# Patient Record
Sex: Female | Born: 1990 | Race: Black or African American | Hispanic: No | Marital: Single | Smoking: Former smoker
Health system: Southern US, Community
[De-identification: ages and names within clinical notes are randomized; demographics above are authoritative.]

## PROBLEM LIST (undated history)

## (undated) DIAGNOSIS — K219 Gastro-esophageal reflux disease without esophagitis: Secondary | ICD-10-CM

## (undated) DIAGNOSIS — IMO0001 Reserved for inherently not codable concepts without codable children: Secondary | ICD-10-CM

---

## 2011-04-29 ENCOUNTER — Emergency Department (HOSPITAL_BASED_OUTPATIENT_CLINIC_OR_DEPARTMENT_OTHER)
Admission: EM | Admit: 2011-04-29 | Discharge: 2011-04-29 | Disposition: A | Discharge status: Home / Self Care | Payer: Self-pay | Attending: Emergency Medicine | Admitting: Emergency Medicine

## 2011-04-29 ENCOUNTER — Encounter: Payer: Self-pay | Admitting: *Deleted

## 2011-04-29 DIAGNOSIS — J45909 Unspecified asthma, uncomplicated: Secondary | ICD-10-CM | POA: Insufficient documentation

## 2011-04-29 DIAGNOSIS — J069 Acute upper respiratory infection, unspecified: Secondary | ICD-10-CM | POA: Insufficient documentation

## 2011-04-29 DIAGNOSIS — R51 Headache: Secondary | ICD-10-CM | POA: Insufficient documentation

## 2011-04-29 MED ORDER — FLUTICASONE PROPIONATE 50 MCG/ACT NA SUSP: 2.0000 | Freq: Every day | NASAL | Status: DC

## 2011-04-29 NOTE — ED Notes (Signed)
Pt states she has had congestion, H/A, sinus pressure and body aches for 2 days. Mother had flu.

## 2011-04-29 NOTE — Discharge Instructions (Signed)
Antibiotic Nonuse  Your caregiver felt that the infection or problem was not one that would be helped with an antibiotic. Infections may be caused by viruses or bacteria. Only a caregiver can tell which one of these is the likely cause of an illness. A cold is the most common cause of infection in both adults and children. A cold is a virus. Antibiotic treatment will have no effect on a viral infection. Viruses can lead to many lost days of work caring for sick children and many missed days of school. Children may catch as many as 10 "colds" or "flus" per year during which they can be tearful, cranky, and uncomfortable. The goal of treating a virus is aimed at keeping the ill person comfortable. Antibiotics are medications used to help the body fight bacterial infections. There are relatively few types of bacteria that cause infections but there are hundreds of viruses. While both viruses and bacteria cause infection they are very different types of germs. A viral infection will typically go away by itself within 7 to 10 days. Bacterial infections may spread or get worse without antibiotic treatment. Examples of bacterial infections are:  Sore throats (like strep throat or tonsillitis).     Infection in the lung (pneumonia).     Ear and skin infections.  Examples of viral infections are:  Colds or flus.     Most coughs and bronchitis.     Sore throats not caused by Strep.     Runny noses.  It is often best not to take an antibiotic when a viral infection is the cause of the problem. Antibiotics can kill off the helpful bacteria that we have inside our body and allow harmful bacteria to start growing. Antibiotics can cause side effects such as allergies, nausea, and diarrhea without helping to improve the symptoms of the viral infection. Additionally, repeated uses of antibiotics can cause bacteria inside of our body to become resistant. That resistance can be passed onto harmful bacterial. The  next time you have an infection it may be harder to treat if antibiotics are used when they are not needed. Not treating with antibiotics allows our own immune system to develop and take care of infections more efficiently. Also, antibiotics will work better for us when they are prescribed for bacterial infections. Treatments for a child that is ill may include:  Give extra fluids throughout the day to stay hydrated.     Get plenty of rest.     Only give your child over-the-counter or prescription medicines for pain, discomfort, or fever as directed by your caregiver.     The use of a cool mist humidifier may help stuffy noses.     Cold medications if suggested by your caregiver.  Your caregiver may decide to start you on an antibiotic if:  The problem you were seen for today continues for a longer length of time than expected.     You develop a secondary bacterial infection.  SEEK MEDICAL CARE IF:  Fever lasts longer than 5 days.     Symptoms continue to get worse after 5 to 7 days or become severe.     Difficulty in breathing develops.     Signs of dehydration develop (poor drinking, rare urinating, dark colored urine).     Changes in behavior or worsening tiredness (listlessness or lethargy).  Document Released: 06/18/2001 Document Revised: 12/20/2010 Document Reviewed: 12/15/2008 [employer] Patient Information 2012 [employer]. 

## 2011-04-29 NOTE — ED Provider Notes (Signed)
History     CSN: [id number]  Arrival date & time 04/29/11  1646   First MD Initiated Contact with Patient 04/29/11 1759      Chief Complaint  Patient presents with  . Influenza    (Consider location/radiation/quality/duration/timing/severity/associated sxs/prior treatment) Patient is a 21 y.o. female presenting with flu symptoms. The history is provided by the patient. No language interpreter was used.  Influenza This is a new problem. The current episode started in the past 7 days. The problem occurs constantly. The problem has been unchanged. Associated symptoms include headaches and a sore throat. Pertinent negatives include no fever or vomiting. The symptoms are aggravated by nothing. She has tried NSAIDs and acetaminophen for the symptoms.    Past Medical History  Diagnosis Date  . Asthma     History reviewed. No pertinent past surgical history.  History reviewed. No pertinent family history.  History  Substance Use Topics  . Smoking status: Current Some Day Smoker  . Smokeless tobacco: Not on file  . Alcohol Use: No    OB History    Grav Para Term Preterm Abortions TAB SAB Ect Mult Living                  Review of Systems  Constitutional: Negative for fever.  HENT: Positive for sore throat.   Gastrointestinal: Negative for vomiting.  Neurological: Positive for headaches.  All other systems reviewed and are negative.    Allergies  Review of patient's allergies indicates no known allergies.  Home Medications   Current Outpatient Rx  Name Route Sig Dispense Refill  . GUAIFENESIN 100 MG/5ML PO SYRP Oral Take 200 mg by mouth every 4 (four) hours as needed. For cough     . OMEPRAZOLE 20 MG PO CPDR Oral Take 20 mg by mouth daily.      Marland Kitchen PHENYLEPH-CPM-DM-ASPIRIN 7.11-22-08-325 MG PO TBEF Oral Take 2 tablets by mouth every 4 (four) hours as needed. For congestion     . FLUTICASONE PROPIONATE 50 MCG/ACT NA SUSP Nasal Place 2 sprays into the nose daily. 16 g 2     BP 132/80  Pulse 104  Temp(Src) 99.4 F (37.4 C) (Oral)  Resp 18  Ht 5\' 4"  (1.626 m)  Wt 150 lb (68.04 kg)  BMI 25.75 kg/m2  SpO2 100%  Physical Exam  Nursing note and vitals reviewed. Constitutional: She is oriented to person, place, and time. She appears well-developed and well-nourished.  HENT:  Head: Normocephalic and atraumatic.  Right Ear: External ear normal.  Left Ear: External ear normal.  Nose: Rhinorrhea present.  Mouth/Throat: Posterior oropharyngeal erythema present.  Neck: Neck supple.  Cardiovascular: Normal rate and regular rhythm.   Pulmonary/Chest: Effort normal and breath sounds normal.  Abdominal: Soft. Bowel sounds are normal.  Musculoskeletal: Normal range of motion.  Neurological: She is alert and oriented to person, place, and time.    ED Course  Procedures (including critical care time)  Labs Reviewed - No data to display No results found.   1. URI (upper respiratory infection)       MDM  Consistent with WGN:FAOZ treat symptomatically        Teressa Lower, NP 04/29/11 1820

## 2011-05-04 NOTE — ED Provider Notes (Signed)
Evaluation and management procedures were performed by the PA/NP under my supervision/collaboration.    Eastin Swing D Zayveon Raschke, MD 05/04/11 1718 

## 2011-07-26 ENCOUNTER — Encounter (HOSPITAL_BASED_OUTPATIENT_CLINIC_OR_DEPARTMENT_OTHER): Payer: Self-pay

## 2011-07-26 ENCOUNTER — Emergency Department (HOSPITAL_BASED_OUTPATIENT_CLINIC_OR_DEPARTMENT_OTHER)
Admission: EM | Admit: 2011-07-26 | Discharge: 2011-07-26 | Disposition: A | Discharge status: Home / Self Care | Payer: Self-pay | Attending: Emergency Medicine | Admitting: Emergency Medicine

## 2011-07-26 DIAGNOSIS — F17200 Nicotine dependence, unspecified, uncomplicated: Secondary | ICD-10-CM | POA: Insufficient documentation

## 2011-07-26 DIAGNOSIS — K219 Gastro-esophageal reflux disease without esophagitis: Secondary | ICD-10-CM | POA: Insufficient documentation

## 2011-07-26 DIAGNOSIS — R109 Unspecified abdominal pain: Secondary | ICD-10-CM | POA: Insufficient documentation

## 2011-07-26 HISTORY — DX: Reserved for inherently not codable concepts without codable children: IMO0001

## 2011-07-26 HISTORY — DX: Gastro-esophageal reflux disease without esophagitis: K21.9

## 2011-07-26 LAB — URINALYSIS, ROUTINE W REFLEX MICROSCOPIC
Bilirubin Urine: NEGATIVE
Glucose, UA: NEGATIVE mg/dL
Hgb urine dipstick: NEGATIVE
Ketones, ur: NEGATIVE mg/dL
Protein, ur: 30 mg/dL — AB

## 2011-07-26 LAB — URINE MICROSCOPIC-ADD ON

## 2011-07-26 NOTE — ED Provider Notes (Signed)
Medical screening examination/treatment/procedure(s) were performed by non-physician practitioner and as supervising physician I was immediately available for consultation/collaboration.   Rosielee Corporan, MD 07/26/11 1625 

## 2011-07-26 NOTE — ED Notes (Signed)
Pt reports onset of nausea and abdominal pain that started yesterday.

## 2011-07-26 NOTE — ED Provider Notes (Signed)
History     CSN: [id number]  Arrival date & time 07/26/11  1347   First MD Initiated Contact with Patient 07/26/11 1437      Chief Complaint  Patient presents with  . Abdominal Pain  . Nausea    (Consider location/radiation/quality/duration/timing/severity/associated sxs/prior treatment) HPI Comments: Pt state that she had an onset of upper abdominal pain yesterday which has resolved today:pt states that she came in today because she was seen at [facility] yesterday and they called her and told her she had a sign of infection in her blood work:pt state that she was recently diagnosed with gerd and she has had these symptoms previously:pt denies fever or previous surgeries  Patient is a 21 y.o. female presenting with abdominal pain. The history is provided by the patient. No language interpreter was used.  Abdominal Pain The primary symptoms of the illness include abdominal pain and nausea. The primary symptoms of the illness do not include fever, vomiting, diarrhea, vaginal discharge or vaginal bleeding. The current episode started yesterday. The onset of the illness was gradual. The problem has been resolved.  The patient states that she believes she is currently not pregnant. The patient has not had a change in bowel habit. Symptoms associated with the illness do not include urgency, hematuria, frequency or back pain.    Past Medical History  Diagnosis Date  . Asthma   . Reflux     History reviewed. No pertinent past surgical history.  No family history on file.  History  Substance Use Topics  . Smoking status: Current Some Day Smoker  . Smokeless tobacco: Not on file  . Alcohol Use: No    OB History    Grav Para Term Preterm Abortions TAB SAB Ect Mult Living                  Review of Systems  Constitutional: Negative for fever.  Gastrointestinal: Positive for nausea and abdominal pain. Negative for vomiting and diarrhea.  Genitourinary: Negative for urgency,  frequency, hematuria, vaginal bleeding and vaginal discharge.  Musculoskeletal: Negative for back pain.  All other systems reviewed and are negative.    Allergies  Review of patient's allergies indicates no known allergies.  Home Medications   Current Outpatient Rx  Name Route Sig Dispense Refill  . OMEPRAZOLE PO Oral Take 1 capsule by mouth daily as needed.    Marland Kitchen ZEGERID PO Oral Take 1 tablet by mouth daily as needed. Patient used this medication for acid reflux.    . OMEPRAZOLE 20 MG PO CPDR Oral Take 20 mg by mouth daily.        BP 121/84  Pulse 106  Temp(Src) 98.1 F (36.7 C) (Oral)  Resp 16  Ht 5\' 4"  (1.626 m)  Wt 160 lb (72.576 kg)  BMI 27.46 kg/m2  SpO2 99%  Physical Exam  Nursing note and vitals reviewed. Constitutional: She is oriented to person, place, and time. She appears well-developed and well-nourished.  HENT:  Head: Normocephalic and atraumatic.  Eyes: Conjunctivae and EOM are normal.  Neck: Neck supple.  Cardiovascular: Normal rate and regular rhythm.   Pulmonary/Chest: Effort normal and breath sounds normal.  Abdominal: Soft. Bowel sounds are normal. There is no tenderness.  Musculoskeletal: Normal range of motion.  Neurological: She is alert and oriented to person, place, and time.  Skin: Skin is warm and dry.  Psychiatric: She has a normal mood and affect.    ED Course  Procedures (including critical care time)  Labs Reviewed  URINALYSIS, ROUTINE W REFLEX MICROSCOPIC - Abnormal; Notable for the following:    Protein, ur 30 (*)    All other components within normal limits  URINE MICROSCOPIC-ADD ON - Abnormal; Notable for the following:    Squamous Epithelial / LPF FEW (*)    Bacteria, UA FEW (*)    Casts HYALINE CASTS (*)    All other components within normal limits  PREGNANCY, URINE   No results found.   1. Abdominal pain       MDM  [facility] and [facility];pt had and elevated wbc:don't think pt needs any  imaging:pt is not symptomatic today and abdominal exam is benign        Teressa Lower, NP 07/26/11 1601

## 2011-07-26 NOTE — Discharge Instructions (Signed)

## 2012-06-21 ENCOUNTER — Encounter (HOSPITAL_BASED_OUTPATIENT_CLINIC_OR_DEPARTMENT_OTHER): Payer: Self-pay | Admitting: *Deleted

## 2012-06-21 ENCOUNTER — Emergency Department (HOSPITAL_BASED_OUTPATIENT_CLINIC_OR_DEPARTMENT_OTHER)
Admission: EM | Admit: 2012-06-21 | Discharge: 2012-06-21 | Disposition: A | Discharge status: Home / Self Care | Payer: Self-pay | Attending: Emergency Medicine | Admitting: Emergency Medicine

## 2012-06-21 DIAGNOSIS — N760 Acute vaginitis: Secondary | ICD-10-CM | POA: Insufficient documentation

## 2012-06-21 DIAGNOSIS — Z79899 Other long term (current) drug therapy: Secondary | ICD-10-CM | POA: Insufficient documentation

## 2012-06-21 DIAGNOSIS — R51 Headache: Secondary | ICD-10-CM | POA: Insufficient documentation

## 2012-06-21 DIAGNOSIS — K219 Gastro-esophageal reflux disease without esophagitis: Secondary | ICD-10-CM | POA: Insufficient documentation

## 2012-06-21 DIAGNOSIS — R110 Nausea: Secondary | ICD-10-CM | POA: Insufficient documentation

## 2012-06-21 DIAGNOSIS — B9689 Other specified bacterial agents as the cause of diseases classified elsewhere: Secondary | ICD-10-CM

## 2012-06-21 DIAGNOSIS — N898 Other specified noninflammatory disorders of vagina: Secondary | ICD-10-CM | POA: Insufficient documentation

## 2012-06-21 DIAGNOSIS — Z3202 Encounter for pregnancy test, result negative: Secondary | ICD-10-CM | POA: Insufficient documentation

## 2012-06-21 DIAGNOSIS — N949 Unspecified condition associated with female genital organs and menstrual cycle: Secondary | ICD-10-CM | POA: Insufficient documentation

## 2012-06-21 DIAGNOSIS — J45909 Unspecified asthma, uncomplicated: Secondary | ICD-10-CM | POA: Insufficient documentation

## 2012-06-21 DIAGNOSIS — J3489 Other specified disorders of nose and nasal sinuses: Secondary | ICD-10-CM | POA: Insufficient documentation

## 2012-06-21 DIAGNOSIS — F17200 Nicotine dependence, unspecified, uncomplicated: Secondary | ICD-10-CM | POA: Insufficient documentation

## 2012-06-21 LAB — CBC WITH DIFFERENTIAL/PLATELET
Hemoglobin: 12.4 g/dL (ref 12.0–15.0)
Lymphs Abs: 1.7 10*3/uL (ref 0.7–4.0)
MCH: 27.2 pg (ref 26.0–34.0)
Monocytes Relative: 6 % (ref 3–12)
Neutro Abs: 8.9 10*3/uL — ABNORMAL HIGH (ref 1.7–7.7)
Neutrophils Relative %: 76 % (ref 43–77)
RBC: 4.56 MIL/uL (ref 3.87–5.11)

## 2012-06-21 LAB — URINALYSIS, ROUTINE W REFLEX MICROSCOPIC
Hgb urine dipstick: NEGATIVE
Protein, ur: NEGATIVE mg/dL
Urobilinogen, UA: 0.2 mg/dL (ref 0.0–1.0)

## 2012-06-21 LAB — WET PREP, GENITAL

## 2012-06-21 LAB — PREGNANCY, URINE: Preg Test, Ur: NEGATIVE

## 2012-06-21 MED ORDER — FLUCONAZOLE 150 MG PO TABS: 150.0000 mg | ORAL_TABLET | Freq: Once | ORAL | Status: DC

## 2012-06-21 MED ORDER — RANITIDINE HCL 150 MG PO TABS: 150.0000 mg | ORAL_TABLET | Freq: Two times a day (BID) | ORAL | Status: DC

## 2012-06-21 MED ORDER — METRONIDAZOLE 500 MG PO TABS: 500.0000 mg | ORAL_TABLET | Freq: Two times a day (BID) | ORAL | Status: DC

## 2012-06-21 NOTE — ED Provider Notes (Signed)
Medical screening examination/treatment/procedure(s) were performed by non-physician practitioner and as supervising physician I was immediately available for consultation/collaboration.   Surena Welge B. Rahmel Nedved, MD 06/21/12 1915 

## 2012-06-21 NOTE — ED Notes (Signed)
Pt states she has a hx of reflux and has been having abd pain and bloating x 1 month. No relief with meds. Trouble with BM's last one today. No urinary s/s. Some clr vag d/c.

## 2012-06-21 NOTE — ED Provider Notes (Signed)
History     CSN: [id number]  Arrival date & time 06/21/12  1659   First MD Initiated Contact with Patient 06/21/12 1705      Chief Complaint  Patient presents with  . Abdominal Pain    (Consider location/radiation/quality/duration/timing/severity/associated sxs/prior Treatment)  HPI Jasmin Williams is a 22 y.o. female who presents to the ED with abdominal pain. The pain started one month ago. The pain is located in the lower abdomen. She rates the pain as 7/10. She describes the pain as feeling bloated and cramping. LMP 06/07/12. No birth control. Vaginal discharge. Last pap smear was last year and was normal. Current sex partner x 7 years. No history of STI's. She reports having more BM's than usual and softer but not diarrhea.  She has a history of reflux. Has been on Prilosec in the past not not currently. Has taken some over the counter medication without relief. The history was provided by the patient.   Past Medical History  Diagnosis Date  . Asthma   . Reflux     History reviewed. No pertinent past surgical history.  History reviewed. No pertinent family history.  History  Substance Use Topics  . Smoking status: Current Some Day Smoker  . Smokeless tobacco: Not on file  . Alcohol Use: No    OB History   Grav Para Term Preterm Abortions TAB SAB Ect Mult Living                  Review of Systems  Constitutional: Negative for fever, chills, diaphoresis and fatigue.  HENT: Positive for sinus pressure. Negative for ear pain, congestion, sore throat, facial swelling, neck pain, neck stiffness and dental problem.   Eyes: Negative for photophobia, pain and discharge.  Respiratory: Negative for cough, chest tightness and wheezing.   Gastrointestinal: Positive for nausea and abdominal pain. Negative for vomiting, diarrhea, constipation and abdominal distention.  Genitourinary: Positive for vaginal discharge and pelvic pain. Negative for dysuria, frequency, flank pain,  vaginal bleeding and difficulty urinating.  Musculoskeletal: Negative for myalgias, back pain and gait problem.  Skin: Negative for color change and rash.  Allergic/Immunologic: Negative for immunocompromised state.  Neurological: Positive for headaches. Negative for dizziness, speech difficulty, weakness, light-headedness and numbness.  Psychiatric/Behavioral: Negative for confusion and agitation. The patient is not nervous/anxious.     Allergies  Review of patient's allergies indicates no known allergies.  Home Medications   Current Outpatient Rx  Name  Route  Sig  Dispense  Refill  . omeprazole (PRILOSEC) 20 MG capsule   Oral   Take 20 mg by mouth daily.           Marland Kitchen OMEPRAZOLE PO   Oral   Take 1 capsule by mouth daily as needed.         Maxwell Caul Bicarbonate (ZEGERID PO)   Oral   Take 1 tablet by mouth daily as needed. Patient used this medication for acid reflux.           BP 122/76  Pulse 98  Temp(Src) 98.2 F (36.8 C) (Oral)  Resp 20  Ht 5\' 3"  (1.6 m)  Wt 180 lb (81.647 kg)  BMI 31.89 kg/m2  SpO2 100%  LMP 06/06/2012  Physical Exam  Nursing note and vitals reviewed. Constitutional: She is oriented to person, place, and time. She appears well-developed and well-nourished.  HENT:  Head: Normocephalic and atraumatic.  Eyes: EOM are normal. Pupils are equal, round, and reactive to light.  Neck: Neck supple.  Cardiovascular: Normal rate.   Pulmonary/Chest: Effort normal. No respiratory distress.  Abdominal: Soft.  Mildly tender lower abdomen. No guarding or rebound.  Genitourinary:  External genitalia without lesions. White discharge vaginal vault. Mild CMT, left adnexal tenderness that is mild. Uterus without palpable enlargement.  Musculoskeletal: Normal range of motion. She exhibits no edema.  Neurological: She is alert and oriented to person, place, and time. No cranial nerve deficit.  Skin: Skin is warm and dry.  Psychiatric: She has a  normal mood and affect. Her behavior is normal. Judgment and thought content normal.   Results for orders placed during the hospital encounter of 06/21/12 (from the past 24 hour(s))  PREGNANCY, URINE     Status: None   Collection Time    06/21/12  5:09 PM      Result Value Range   Preg Test, Ur NEGATIVE  NEGATIVE  URINALYSIS, ROUTINE W REFLEX MICROSCOPIC     Status: Abnormal   Collection Time    06/21/12  5:09 PM      Result Value Range   Color, Urine YELLOW  YELLOW   APPearance CLOUDY (*) CLEAR   Specific Gravity, Urine 1.014  1.005 - 1.030   pH 5.5  5.0 - 8.0   Glucose, UA NEGATIVE  NEGATIVE mg/dL   Hgb urine dipstick NEGATIVE  NEGATIVE   Bilirubin Urine NEGATIVE  NEGATIVE   Ketones, ur NEGATIVE  NEGATIVE mg/dL   Protein, ur NEGATIVE  NEGATIVE mg/dL   Urobilinogen, UA 0.2  0.0 - 1.0 mg/dL   Nitrite NEGATIVE  NEGATIVE   Leukocytes, UA NEGATIVE  NEGATIVE  CBC WITH DIFFERENTIAL     Status: Abnormal   Collection Time    06/21/12  5:23 PM      Result Value Range   WBC 11.7 (*) 4.0 - 10.5 K/uL   RBC 4.56  3.87 - 5.11 MIL/uL   Hemoglobin 12.4  12.0 - 15.0 g/dL   HCT 16.1 (*) 09.6 - 04.5 %   MCV 78.3  78.0 - 100.0 fL   MCH 27.2  26.0 - 34.0 pg   MCHC 34.7  30.0 - 36.0 g/dL   RDW 40.9  81.1 - 91.4 %   Platelets 306  150 - 400 K/uL   Neutrophils Relative 76  43 - 77 %   Neutro Abs 8.9 (*) 1.7 - 7.7 K/uL   Lymphocytes Relative 14  12 - 46 %   Lymphs Abs 1.7  0.7 - 4.0 K/uL   Monocytes Relative 6  3 - 12 %   Monocytes Absolute 0.7  0.1 - 1.0 K/uL   Eosinophils Relative 3  0 - 5 %   Eosinophils Absolute 0.4  0.0 - 0.7 K/uL   Basophils Relative 0  0 - 1 %   Basophils Absolute 0.0  0.0 - 0.1 K/uL  WET PREP, GENITAL     Status: Abnormal   Collection Time    06/21/12  5:48 PM      Result Value Range   Yeast Wet Prep HPF POC FEW (*) NONE SEEN   Trich, Wet Prep NONE SEEN  NONE SEEN   Clue Cells Wet Prep HPF POC MODERATE (*) NONE SEEN   WBC, Wet Prep HPF POC FEW (*) NONE SEEN     Assessment: 22 y.o. female with abdominal/pelvic pain/vaginal discharge   Bacterial vaginosis   Monilia vaginosis  Plan:  Rx flagyl   Ibuprofen   Follow up with GYN Discussed with the patient and all questioned fully  answered.    Medication List    TAKE these medications       fluconazole 150 MG tablet  Commonly known as:  DIFLUCAN  Take 1 tablet (150 mg total) by mouth once.     metroNIDAZOLE 500 MG tablet  Commonly known as:  FLAGYL  Take 1 tablet (500 mg total) by mouth 2 (two) times daily.      ASK your doctor about these medications       omeprazole 20 MG capsule  Commonly known as:  PRILOSEC  Take 20 mg by mouth daily.     OMEPRAZOLE PO  Take 1 capsule by mouth daily as needed.     ZEGERID PO  Take 1 tablet by mouth daily as needed. Patient used this medication for acid reflux.        Procedures       Janne Napoleon, NP 06/21/12 1824

## 2012-06-23 LAB — GC/CHLAMYDIA PROBE AMP: CT Probe RNA: NEGATIVE

## 2012-08-25 ENCOUNTER — Emergency Department (HOSPITAL_BASED_OUTPATIENT_CLINIC_OR_DEPARTMENT_OTHER): Payer: Medicaid Other

## 2012-08-25 ENCOUNTER — Emergency Department (HOSPITAL_BASED_OUTPATIENT_CLINIC_OR_DEPARTMENT_OTHER)
Admission: EM | Admit: 2012-08-25 | Discharge: 2012-08-26 | Disposition: A | Discharge status: Home / Self Care | Payer: Medicaid Other | Attending: Emergency Medicine | Admitting: Emergency Medicine

## 2012-08-25 ENCOUNTER — Encounter (HOSPITAL_BASED_OUTPATIENT_CLINIC_OR_DEPARTMENT_OTHER): Payer: Self-pay | Admitting: *Deleted

## 2012-08-25 DIAGNOSIS — F17200 Nicotine dependence, unspecified, uncomplicated: Secondary | ICD-10-CM | POA: Insufficient documentation

## 2012-08-25 DIAGNOSIS — R0789 Other chest pain: Secondary | ICD-10-CM

## 2012-08-25 DIAGNOSIS — Z79899 Other long term (current) drug therapy: Secondary | ICD-10-CM | POA: Insufficient documentation

## 2012-08-25 DIAGNOSIS — K219 Gastro-esophageal reflux disease without esophagitis: Secondary | ICD-10-CM | POA: Insufficient documentation

## 2012-08-25 DIAGNOSIS — J45901 Unspecified asthma with (acute) exacerbation: Secondary | ICD-10-CM | POA: Insufficient documentation

## 2012-08-25 DIAGNOSIS — R071 Chest pain on breathing: Secondary | ICD-10-CM | POA: Insufficient documentation

## 2012-08-25 DIAGNOSIS — Z202 Contact with and (suspected) exposure to infections with a predominantly sexual mode of transmission: Secondary | ICD-10-CM | POA: Insufficient documentation

## 2012-08-25 LAB — URINALYSIS, ROUTINE W REFLEX MICROSCOPIC
Glucose, UA: NEGATIVE mg/dL
Hgb urine dipstick: NEGATIVE
Specific Gravity, Urine: 1.012 (ref 1.005–1.030)
pH: 6 (ref 5.0–8.0)

## 2012-08-25 LAB — CBC WITH DIFFERENTIAL/PLATELET
Basophils Absolute: 0 10*3/uL (ref 0.0–0.1)
HCT: 34.6 % — ABNORMAL LOW (ref 36.0–46.0)
Hemoglobin: 12.3 g/dL (ref 12.0–15.0)
Lymphocytes Relative: 18 % (ref 12–46)
Monocytes Absolute: 0.8 10*3/uL (ref 0.1–1.0)
Monocytes Relative: 7 % (ref 3–12)
Neutro Abs: 8.6 10*3/uL — ABNORMAL HIGH (ref 1.7–7.7)
WBC: 11.7 10*3/uL — ABNORMAL HIGH (ref 4.0–10.5)

## 2012-08-25 LAB — D-DIMER, QUANTITATIVE: D-Dimer, Quant: 0.65 ug/mL-FEU — ABNORMAL HIGH (ref 0.00–0.48)

## 2012-08-25 LAB — BASIC METABOLIC PANEL
BUN: 8 mg/dL (ref 6–23)
CO2: 26 mEq/L (ref 19–32)
Chloride: 101 mEq/L (ref 96–112)
Creatinine, Ser: 0.7 mg/dL (ref 0.50–1.10)
Potassium: 3.4 mEq/L — ABNORMAL LOW (ref 3.5–5.1)

## 2012-08-25 LAB — PREGNANCY, URINE: Preg Test, Ur: NEGATIVE

## 2012-08-25 NOTE — ED Notes (Addendum)
States she wants to get tested for STDs. States she had sex with someone with an STD. Denies vaginal discharge. She complains of a headache and chest pain that she feels is anxiety.

## 2012-08-25 NOTE — ED Provider Notes (Signed)
History  This chart was scribed for Glynn Octave, MD by Shari Heritage, ED Scribe. The patient was seen in room MH08/MH08. Patient's care was started at 2208.   CSN: [id number]  Arrival date & time 08/25/12  2208   First MD Initiated Contact with Patient 08/25/12 2313      Chief Complaint  Patient presents with  . Chest Pain     The history is provided by the patient. No language interpreter was used.    HPI Comments: Jasmin Williams is a 22 y.o. female with history of asthma and reflux who presents to the Emergency Department complaining of intermittent, sharp chest pain underneath the left breast onset 3-4 days ago. Pain is worse with cough. Pain is unchanged with palpation. She states that pain usually begins in the morning and is present intermittently throughout the day. There is associated mild shortness of breath. She denies back pain,  nausea, vomiting, headache, leg swelling or any other symptoms. Patient hasn't taken any recent car or plane trips. She has no history of DVT/PE. She is a current some day smoker.  Patient is also asking to been screened for STDs. Patient denies vaginal bleeding or vaginal discharge, but states she has some vaginal itching. She denies having had unprotected intercourse, but thinks she may have contracted it orally somehow. She is not using any birth control.   Past Medical History  Diagnosis Date  . Asthma   . Reflux     History reviewed. No pertinent past surgical history.  No family history on file.  History  Substance Use Topics  . Smoking status: Current Some Day Smoker    Types: Cigarettes  . Smokeless tobacco: Not on file  . Alcohol Use: No    OB History   Grav Para Term Preterm Abortions TAB SAB Ect Mult Living                  Review of Systems A complete 10 system review of systems was obtained and all systems are negative except as noted in the HPI and PMH.   Allergies  Review of patient's allergies indicates no known  allergies.  Home Medications   Current Outpatient Rx  Name  Route  Sig  Dispense  Refill  . fluconazole (DIFLUCAN) 150 MG tablet   Oral   Take 1 tablet (150 mg total) by mouth once.   1 tablet   0   . metroNIDAZOLE (FLAGYL) 500 MG tablet   Oral   Take 1 tablet (500 mg total) by mouth 2 (two) times daily.   14 tablet   0   . omeprazole (PRILOSEC) 20 MG capsule   Oral   Take 20 mg by mouth daily.           Marland Kitchen OMEPRAZOLE PO   Oral   Take 1 capsule by mouth daily as needed.         Maxwell Caul Bicarbonate (ZEGERID PO)   Oral   Take 1 tablet by mouth daily as needed. Patient used this medication for acid reflux.         . ranitidine (ZANTAC) 150 MG tablet   Oral   Take 1 tablet (150 mg total) by mouth 2 (two) times daily.   60 tablet   0     Triage Vitals: BP 140/84  Pulse 114  Temp(Src) 98.4 F (36.9 C) (Oral)  Resp 18  SpO2 100%  Physical Exam  Constitutional: She is oriented to person, place, and time.  She appears well-developed and well-nourished.  HENT:  Head: Normocephalic and atraumatic.  Eyes: Conjunctivae and EOM are normal. Pupils are equal, round, and reactive to light.  Neck: Normal range of motion. Neck supple.  Cardiovascular: Normal rate, regular rhythm and normal heart sounds.   Pulmonary/Chest: Effort normal and breath sounds normal. She exhibits tenderness (left chest wall).  Abdominal: Soft. There is no tenderness.  Genitourinary: Cervix exhibits no motion tenderness. Right adnexum displays no tenderness. Left adnexum displays no tenderness. Vaginal discharge found.  Scant white discharge. No CMT. No adnexal tenderness.  Musculoskeletal: Normal range of motion.  Neurological: She is alert and oriented to person, place, and time.  Skin: Skin is warm and dry.    ED Course  Procedures (including critical care time) DIAGNOSTIC STUDIES: Oxygen Saturation is 100% on room air, normal by my interpretation.    COORDINATION OF  CARE: 11:27 PM- Patient informed of current plan for treatment and evaluation and agrees with plan at this time.   Labs Reviewed  WET PREP, GENITAL - Abnormal; Notable for the following:    WBC, Wet Prep HPF POC RARE (*)    All other components within normal limits  URINALYSIS, ROUTINE W REFLEX MICROSCOPIC - Abnormal; Notable for the following:    Ketones, ur 15 (*)    All other components within normal limits  CBC WITH DIFFERENTIAL - Abnormal; Notable for the following:    WBC 11.7 (*)    HCT 34.6 (*)    Neutro Abs 8.6 (*)    All other components within normal limits  BASIC METABOLIC PANEL - Abnormal; Notable for the following:    Potassium 3.4 (*)    All other components within normal limits  D-DIMER, QUANTITATIVE - Abnormal; Notable for the following:    D-Dimer, Quant 0.65 (*)    All other components within normal limits  GC/CHLAMYDIA PROBE AMP  PREGNANCY, URINE    Dg Chest 2 View  08/26/2012  *RADIOLOGY REPORT*  Clinical Data: Sternal chest pain  CHEST - 2 VIEW  Comparison: None.  Findings: Normal mediastinum and cardiac silhouette.  Normal pulmonary  vasculature.  No evidence of effusion, infiltrate, or pneumothorax.  No acute bony abnormality.  IMPRESSION: Normal chest radiograph   Original Report Authenticated By: Genevive Bi, M.D.    Ct Angio Chest Pe W/cm &/or Wo Cm  08/26/2012  *RADIOLOGY REPORT*  Clinical Data: 22 year old female with chest pain.  Abnormal D- dimer.  Shortness of breath.  CT ANGIOGRAPHY CHEST  Technique:  Multidetector CT imaging of the chest using the standard protocol during bolus administration of intravenous contrast. Multiplanar reconstructed images including MIPs were obtained and reviewed to evaluate the vascular anatomy.  Contrast: 80mL OMNIPAQUE IOHEXOL 350 MG/ML SOLN  Comparison: Chest radiographs 08/26/2012.  Findings: Good contrast bolus timing in the pulmonary arterial tree.  No focal filling defect identified in the pulmonary arterial tree to  suggest the presence of acute pulmonary embolism.  Major airways are patent.  The lungs are clear.  No osseous abnormality identified.  Negative thoracic inlet.  Visualized aorta and great vessel are within normal limits.  Negative visualized upper abdominal viscera. No pericardial or pleural effusion.  No mediastinal or axillary lymphadenopathy.  IMPRESSION: No evidence of acute pulmonary embolus.  Negative chest CTA.   Original Report Authenticated By: Odessa Fleming III, M.D.      1. Chest wall pain   2. Exposure to STD       MDM  3 days of chest pain,  worse with inspiration worse with palpation. No shortness of breath or fever. Also worried about STD exposure but no vaginal bleeding or discharge.  Lungs clear. Chest wall tender to palpation. Given tachycardia, d-dimer was checked and elevated. CT angiogram negative for PE.  Patient was treated empirically for STD exposure with Rocephin and azithromycin.. Return precautions discussed.    Date: 08/26/2012  Rate: 85  Rhythm: normal sinus rhythm  QRS Axis: normal  Intervals: normal  ST/T Wave abnormalities: normal  Conduction Disutrbances:none  Narrative Interpretation:   Old EKG Reviewed: none available    I personally performed the services described in this documentation, which was scribed in my presence. The recorded information has been reviewed and is accurate.   Glynn Octave, MD 08/26/12 [phone]

## 2012-08-25 NOTE — ED Notes (Signed)
Patient transported to X-ray 

## 2012-08-25 NOTE — ED Notes (Signed)
Pt complains of intermittent chest pain located under left breast.  Pt denies n/v.  Pt complains of mild SOB.  Tender upon palpation.  Pt states "believes it anxiety".  Pt wants to get test for STDs after smoking pot with a person who has an STD.  Pt request pelvic and cultures.  Breath sounds clear bilaterally.  Pt appears anxious.

## 2012-08-26 ENCOUNTER — Emergency Department (HOSPITAL_BASED_OUTPATIENT_CLINIC_OR_DEPARTMENT_OTHER): Payer: Medicaid Other

## 2012-08-26 ENCOUNTER — Telehealth (HOSPITAL_BASED_OUTPATIENT_CLINIC_OR_DEPARTMENT_OTHER): Payer: Self-pay | Admitting: Emergency Medicine

## 2012-08-26 LAB — WET PREP, GENITAL
Clue Cells Wet Prep HPF POC: NONE SEEN
Trich, Wet Prep: NONE SEEN

## 2012-08-26 MED ORDER — AZITHROMYCIN 250 MG PO TABS
1000.0000 mg | ORAL_TABLET | Freq: Once | ORAL | Status: AC
  Administered 2012-08-26: 1000 mg via ORAL
  Filled 2012-08-26: qty 4

## 2012-08-26 MED ORDER — LIDOCAINE HCL 2 % IJ SOLN
INTRAMUSCULAR | Status: AC
  Administered 2012-08-26: 3 mL via INTRAMUSCULAR
  Filled 2012-08-26: qty 20

## 2012-08-26 MED ORDER — CEFTRIAXONE SODIUM 250 MG IJ SOLR
250.0000 mg | Freq: Once | INTRAMUSCULAR | Status: AC
  Administered 2012-08-26: 250 mg via INTRAMUSCULAR
  Filled 2012-08-26 (×2): qty 250

## 2012-08-26 MED ORDER — IOHEXOL 350 MG/ML SOLN
80.0000 mL | Freq: Once | INTRAVENOUS | Status: AC | PRN
  Administered 2012-08-26: 80 mL via INTRAVENOUS

## 2012-08-26 NOTE — Discharge Instructions (Signed)
Chest Wall Pain There is no evidence of heart attack or blood clot in her lung. Followup with your doctor. You were treated for gonorrhea and chlamydia today. Return to the ED for new or worsening symptoms. Chest wall pain is pain in or around the bones and muscles of your chest. It may take up to 6 weeks to get better. It may take longer if you must stay physically active in your work and activities.  CAUSES  Chest wall pain may happen on its own. However, it may be caused by:  A viral illness like the flu.  Injury.  Coughing.  Exercise.  Arthritis.  Fibromyalgia.  Shingles. HOME CARE INSTRUCTIONS   Avoid overtiring physical activity. Try not to strain or perform activities that cause pain. This includes any activities using your chest or your abdominal and side muscles, especially if heavy weights are used.  Put ice on the sore area.  Put ice in a plastic bag.  Place a towel between your skin and the bag.  Leave the ice on for 15 to 20 minutes per hour while awake for the first 2 days.  Only take over-the-counter or prescription medicines for pain, discomfort, or fever as directed by your caregiver. SEEK IMMEDIATE MEDICAL CARE IF:   Your pain increases, or you are very uncomfortable.  You have a fever.  Your chest pain becomes worse.  You have new, unexplained symptoms.  You have nausea or vomiting.  You feel sweaty or lightheaded.  You have a cough with phlegm (sputum), or you cough up blood. MAKE SURE YOU:   Understand these instructions.  Will watch your condition.  Will get help right away if you are not doing well or get worse. Document Released: 04/09/2005 Document Revised: 07/02/2011 Document Reviewed: 12/04/2010 [facility] Patient Information 2013 [place], [place].

## 2012-08-26 NOTE — ED Notes (Signed)
The patient called regarding medications given in the ER last night during her visit and states that she is having some diarrhea and nausea.  Pt was advised that she was given two antibiotics, Rocephin and Azithromycin, discussed possible side effects and treatment of the side effects.  Pt was advised that if her symptoms worsen or do not resolve to return to ED or FU with PCP for re-evaluation.

## 2012-08-26 NOTE — ED Notes (Signed)
MD at bedside. 

## 2012-08-27 ENCOUNTER — Telehealth (HOSPITAL_COMMUNITY): Payer: Self-pay | Admitting: Emergency Medicine

## 2012-10-22 ENCOUNTER — Emergency Department (HOSPITAL_BASED_OUTPATIENT_CLINIC_OR_DEPARTMENT_OTHER): Payer: Medicaid Other

## 2012-10-22 ENCOUNTER — Emergency Department (HOSPITAL_BASED_OUTPATIENT_CLINIC_OR_DEPARTMENT_OTHER)
Admission: EM | Admit: 2012-10-22 | Discharge: 2012-10-22 | Disposition: A | Discharge status: Home / Self Care | Payer: Medicaid Other | Attending: Emergency Medicine | Admitting: Emergency Medicine

## 2012-10-22 ENCOUNTER — Encounter (HOSPITAL_BASED_OUTPATIENT_CLINIC_OR_DEPARTMENT_OTHER): Payer: Self-pay

## 2012-10-22 DIAGNOSIS — R002 Palpitations: Secondary | ICD-10-CM | POA: Insufficient documentation

## 2012-10-22 DIAGNOSIS — F410 Panic disorder [episodic paroxysmal anxiety] without agoraphobia: Secondary | ICD-10-CM | POA: Insufficient documentation

## 2012-10-22 DIAGNOSIS — J45909 Unspecified asthma, uncomplicated: Secondary | ICD-10-CM | POA: Insufficient documentation

## 2012-10-22 DIAGNOSIS — Z3202 Encounter for pregnancy test, result negative: Secondary | ICD-10-CM | POA: Insufficient documentation

## 2012-10-22 DIAGNOSIS — R209 Unspecified disturbances of skin sensation: Secondary | ICD-10-CM | POA: Insufficient documentation

## 2012-10-22 DIAGNOSIS — Z8719 Personal history of other diseases of the digestive system: Secondary | ICD-10-CM | POA: Insufficient documentation

## 2012-10-22 DIAGNOSIS — F17200 Nicotine dependence, unspecified, uncomplicated: Secondary | ICD-10-CM | POA: Insufficient documentation

## 2012-10-22 DIAGNOSIS — R000 Tachycardia, unspecified: Secondary | ICD-10-CM | POA: Insufficient documentation

## 2012-10-22 LAB — CBC WITH DIFFERENTIAL/PLATELET
Eosinophils Absolute: 0 10*3/uL (ref 0.0–0.7)
HCT: 35.1 % — ABNORMAL LOW (ref 36.0–46.0)
Hemoglobin: 12.5 g/dL (ref 12.0–15.0)
Lymphs Abs: 1 10*3/uL (ref 0.7–4.0)
MCH: 28 pg (ref 26.0–34.0)
MCHC: 35.6 g/dL (ref 30.0–36.0)
Monocytes Absolute: 0.6 10*3/uL (ref 0.1–1.0)
Monocytes Relative: 7 % (ref 3–12)
Neutro Abs: 6.2 10*3/uL (ref 1.7–7.7)
Neutrophils Relative %: 79 % — ABNORMAL HIGH (ref 43–77)
RBC: 4.46 MIL/uL (ref 3.87–5.11)

## 2012-10-22 LAB — URINE MICROSCOPIC-ADD ON

## 2012-10-22 LAB — BASIC METABOLIC PANEL
BUN: 7 mg/dL (ref 6–23)
Chloride: 104 mEq/L (ref 96–112)
Creatinine, Ser: 0.7 mg/dL (ref 0.50–1.10)
Glucose, Bld: 106 mg/dL — ABNORMAL HIGH (ref 70–99)
Potassium: 3.7 mEq/L (ref 3.5–5.1)

## 2012-10-22 LAB — URINALYSIS, ROUTINE W REFLEX MICROSCOPIC
Bilirubin Urine: NEGATIVE
Ketones, ur: NEGATIVE mg/dL
Specific Gravity, Urine: 1.008 (ref 1.005–1.030)
pH: 6 (ref 5.0–8.0)

## 2012-10-22 MED ORDER — LORAZEPAM 1 MG PO TABS: 1.0000 mg | ORAL_TABLET | Freq: Three times a day (TID) | ORAL | Status: AC | PRN

## 2012-10-22 NOTE — ED Notes (Signed)
Pt reports she was driving and developed palpitations and anxiety.

## 2012-10-22 NOTE — ED Provider Notes (Signed)
History    CSN: [id number] Arrival date & time 10/22/12  1125  First MD Initiated Contact with Patient 10/22/12 1134     Chief Complaint  Patient presents with  . Panic Attack  . Palpitations   (Consider location/radiation/quality/duration/timing/severity/associated sxs/prior Treatment) Patient is a 22 y.o. female presenting with palpitations.  Palpitations  Pt reports she was driving to work a short time ago when she began to feel her heart racing, associated with a fullness in her ears and a tingling in chest and arms. No chest pain, no SOB. She was seen for similar symptoms several weeks ago and had neg workup including CTA chest. She reports having these same symptoms off and on since then, No particular provoking or relieving factors.  Past Medical History  Diagnosis Date  . Asthma   . Reflux    History reviewed. No pertinent past surgical history. No family history on file. History  Substance Use Topics  . Smoking status: Current Some Day Smoker    Types: Cigarettes  . Smokeless tobacco: Not on file  . Alcohol Use: No   OB History   Grav Para Term Preterm Abortions TAB SAB Ect Mult Living                 Review of Systems  Cardiovascular: Positive for palpitations.   All other systems reviewed and are negative except as noted in HPI.   Allergies  Review of patient's allergies indicates no known allergies.  Home Medications  No current outpatient prescriptions on file. BP 153/103  Pulse 127  Temp(Src) 98.5 F (36.9 C) (Oral)  Resp 18  Ht 5\' 4"  (1.626 m)  Wt 173 lb (78.472 kg)  BMI 29.68 kg/m2  SpO2 100%  LMP 10/22/2012 Physical Exam  Nursing note and vitals reviewed. Constitutional: She is oriented to person, place, and time. She appears well-developed and well-nourished.  HENT:  Head: Normocephalic and atraumatic.  Eyes: EOM are normal. Pupils are equal, round, and reactive to light.  Neck: Normal range of motion. Neck supple.  Cardiovascular:  Normal heart sounds and intact distal pulses.  Tachycardia present.   Pulmonary/Chest: Effort normal and breath sounds normal.  Abdominal: Bowel sounds are normal. She exhibits no distension. There is no tenderness.  Musculoskeletal: Normal range of motion. She exhibits no edema and no tenderness.  Neurological: She is alert and oriented to person, place, and time. She has normal strength. No cranial nerve deficit or sensory deficit.  Skin: Skin is warm and dry. No rash noted.  Psychiatric: She has a normal mood and affect.    ED Course  Procedures (including critical care time) Labs Reviewed  CBC WITH DIFFERENTIAL - Abnormal; Notable for the following:    HCT 35.1 (*)    Neutrophils Relative % 79 (*)    All other components within normal limits  BASIC METABOLIC PANEL - Abnormal; Notable for the following:    Glucose, Bld 106 (*)    All other components within normal limits  URINALYSIS, ROUTINE W REFLEX MICROSCOPIC - Abnormal; Notable for the following:    Hgb urine dipstick LARGE (*)    Leukocytes, UA TRACE (*)    All other components within normal limits  URINE MICROSCOPIC-ADD ON - Abnormal; Notable for the following:    Bacteria, UA FEW (*)    All other components within normal limits  PREGNANCY, URINE   Dg Chest 2 View  10/22/2012   *RADIOLOGY REPORT*  Clinical Data: Palpitations.  Panic attack.  Some  chest pain.  CHEST - 2 VIEW  Comparison: 08/26/2012.  Findings: The heart remains normal in size and the lungs are clear with normal vascularity.  Normal appearing bones.  IMPRESSION: Normal examination, unchanged.   Original Report Authenticated By: Beckie Salts, M.D.   1. Panic attack     MDM   Date: 10/22/2012  Rate: 109  Rhythm: sinus tachycardia  QRS Axis: normal  Intervals: normal  ST/T Wave abnormalities: normal  Conduction Disutrbances:none  Narrative Interpretation:   Old EKG Reviewed: changes noted. Faster rate today  Labs unchanged, HR improved with rest in the  ED and she is feeling better. She is likely having panic attacks. Rx for short term Ativan. PCP followup.    Charles B. Bernette Mayers, MD 10/22/12 1314

## 2012-11-28 ENCOUNTER — Emergency Department (HOSPITAL_BASED_OUTPATIENT_CLINIC_OR_DEPARTMENT_OTHER)
Admission: EM | Admit: 2012-11-28 | Discharge: 2012-11-28 | Disposition: A | Discharge status: Home / Self Care | Payer: Self-pay | Attending: Emergency Medicine | Admitting: Emergency Medicine

## 2012-11-28 ENCOUNTER — Encounter (HOSPITAL_BASED_OUTPATIENT_CLINIC_OR_DEPARTMENT_OTHER): Payer: Self-pay

## 2012-11-28 DIAGNOSIS — R51 Headache: Secondary | ICD-10-CM | POA: Insufficient documentation

## 2012-11-28 DIAGNOSIS — Z87891 Personal history of nicotine dependence: Secondary | ICD-10-CM | POA: Insufficient documentation

## 2012-11-28 DIAGNOSIS — Z3202 Encounter for pregnancy test, result negative: Secondary | ICD-10-CM | POA: Insufficient documentation

## 2012-11-28 DIAGNOSIS — R110 Nausea: Secondary | ICD-10-CM | POA: Insufficient documentation

## 2012-11-28 DIAGNOSIS — Z8719 Personal history of other diseases of the digestive system: Secondary | ICD-10-CM | POA: Insufficient documentation

## 2012-11-28 DIAGNOSIS — J45909 Unspecified asthma, uncomplicated: Secondary | ICD-10-CM | POA: Insufficient documentation

## 2012-11-28 DIAGNOSIS — Z79899 Other long term (current) drug therapy: Secondary | ICD-10-CM | POA: Insufficient documentation

## 2012-11-28 MED ORDER — SODIUM CHLORIDE 0.9 % IV BOLUS (SEPSIS)
1000.0000 mL | Freq: Once | INTRAVENOUS | Status: AC
  Administered 2012-11-28: 1000 mL via INTRAVENOUS

## 2012-11-28 MED ORDER — METOCLOPRAMIDE HCL 5 MG/ML IJ SOLN
10.0000 mg | Freq: Once | INTRAMUSCULAR | Status: AC
  Administered 2012-11-28: 10 mg via INTRAVENOUS
  Filled 2012-11-28: qty 2

## 2012-11-28 MED ORDER — KETOROLAC TROMETHAMINE 30 MG/ML IJ SOLN
30.0000 mg | Freq: Once | INTRAMUSCULAR | Status: AC
  Administered 2012-11-28: 30 mg via INTRAVENOUS
  Filled 2012-11-28: qty 1

## 2012-11-28 MED ORDER — IBUPROFEN 600 MG PO TABS: 600.0000 mg | ORAL_TABLET | Freq: Four times a day (QID) | ORAL | Status: AC | PRN

## 2012-11-28 MED ORDER — HYDROCODONE-ACETAMINOPHEN 5-325 MG PO TABS: 1.0000 | ORAL_TABLET | Freq: Four times a day (QID) | ORAL | Status: AC | PRN

## 2012-11-28 NOTE — ED Provider Notes (Signed)
CSN: [id number]     Arrival date & time 11/28/12  1734 History     First MD Initiated Contact with Patient 11/28/12 1905     Chief Complaint  Patient presents with  . Headache   (Consider location/radiation/quality/duration/timing/severity/associated sxs/prior Treatment) HPI Comments:  Jasmin Williams is a 22 y.o. female who complains of headaches for 5 days. Description of pain: sharp pain, squeezing pain. Duration of individual headaches: mostly constant, with intermittent waxing and waning. Associated symptoms: nausea. Pain relief: OTC NSAID's give transient relief. Precipitating factors: patient is aware of none. She denies a history of recent head injury.  Prior neurological history: negative for no neurological problems. Neurologic Review of Systems - no TIA or stroke-like symptoms, no amaurosis, diplopia, abnormal speech, unilateral numbness or weakness, no neck pain stiffness, fevers.   Patient is a 22 y.o. female presenting with headaches. The history is provided by the patient.  Headache Associated symptoms: no abdominal pain, no nausea, no neck pain and no vomiting     Past Medical History  Diagnosis Date  . Asthma   . Reflux    History reviewed. No pertinent past surgical history. No family history on file. History  Substance Use Topics  . Smoking status: Former Games developer  . Smokeless tobacco: Not on file  . Alcohol Use: No   OB History   Grav Para Term Preterm Abortions TAB SAB Ect Mult Living                 Review of Systems  Constitutional: Negative for activity change.  HENT: Negative for neck pain.   Respiratory: Negative for shortness of breath.   Cardiovascular: Negative for chest pain.  Gastrointestinal: Negative for nausea, vomiting and abdominal pain.  Genitourinary: Negative for dysuria.  Neurological: Positive for headaches.    Allergies  Review of patient's allergies indicates no known allergies.  Home Medications   Current Outpatient Rx   Name  Route  Sig  Dispense  Refill  . Diazepam (VALIUM PO)   Oral   Take by mouth.         Marland Kitchen HYDROcodone-acetaminophen (NORCO/VICODIN) 5-325 MG per tablet   Oral   Take 1 tablet by mouth every 6 (six) hours as needed for pain.   10 tablet   0   . ibuprofen (ADVIL,MOTRIN) 600 MG tablet   Oral   Take 1 tablet (600 mg total) by mouth every 6 (six) hours as needed for pain.   30 tablet   0   . LORazepam (ATIVAN) 1 MG tablet   Oral   Take 1 tablet (1 mg total) by mouth 3 (three) times daily as needed for anxiety.   15 tablet   0    BP 123/81  Pulse 98  Temp(Src) 98.2 F (36.8 C) (Oral)  Resp 16  Ht 5\' 3"  (1.6 m)  Wt 173 lb (78.472 kg)  BMI 30.65 kg/m2  SpO2 100%  LMP 11/22/2012 Physical Exam  Constitutional: She is oriented to person, place, and time. She appears well-developed and well-nourished.  HENT:  Head: Normocephalic and atraumatic.  Eyes: Conjunctivae and EOM are normal. Pupils are equal, round, and reactive to light.  Neck: Normal range of motion. Neck supple. No JVD present.  Cardiovascular: Normal rate, regular rhythm, normal heart sounds and intact distal pulses.   No murmur heard. Pulmonary/Chest: Effort normal. No respiratory distress. She has no wheezes.  Abdominal: Soft. Bowel sounds are normal. She exhibits no distension. There is no tenderness. There is no  rebound and no guarding.  Genitourinary: Vagina normal and uterus normal.  External exam - normal, no lesions Speculum exam: Pt has some white discharge, no blood Bimanual exam: Patient has no CMT, no adnexal tenderness or fullness and cervical os is closed  Neurological: She is alert and oriented to person, place, and time. No cranial nerve deficit. Coordination normal.  Skin: Skin is warm and dry.    ED Course   Procedures (including critical care time)  Labs Reviewed  PREGNANCY, URINE   No results found. 1. Headache     MDM  DDX includes: Primary headaches - including migrainous  headaches, cluster headaches, tension headaches. ICH Carotid dissection Cavernous sinus thrombosis Meningitis Encephalitis Sinusitis Tumor Vascular headaches AV malformation Brain aneurysm Muscular headaches  A/P: Pt comes in with cc of headaches. No concerns for life threatening secondary headaches because the headaches are constant x 5 days, with intermittent worsening, no red flags on hx besides nausea, no neuro deficits, no hx of brain CA or aneurysms in the family, no signs of infection on exam.  Headache cocktail given. Neuro f/u in 2 weeks if not better. Return precautions discussed with the patient.  Derwood Kaplan, MD 11/28/12 2055

## 2012-11-28 NOTE — ED Notes (Addendum)
C/o HA x 1 week-"sore" to entire head-pt NAD

## 2013-02-18 ENCOUNTER — Emergency Department (HOSPITAL_BASED_OUTPATIENT_CLINIC_OR_DEPARTMENT_OTHER)
Admission: EM | Admit: 2013-02-18 | Discharge: 2013-02-18 | Disposition: A | Discharge status: Home / Self Care | Payer: Medicaid Other | Attending: Emergency Medicine | Admitting: Emergency Medicine

## 2013-02-18 ENCOUNTER — Encounter (HOSPITAL_BASED_OUTPATIENT_CLINIC_OR_DEPARTMENT_OTHER): Payer: Self-pay | Admitting: Emergency Medicine

## 2013-02-18 DIAGNOSIS — Z8719 Personal history of other diseases of the digestive system: Secondary | ICD-10-CM | POA: Insufficient documentation

## 2013-02-18 DIAGNOSIS — Z79899 Other long term (current) drug therapy: Secondary | ICD-10-CM | POA: Insufficient documentation

## 2013-02-18 DIAGNOSIS — J069 Acute upper respiratory infection, unspecified: Secondary | ICD-10-CM | POA: Insufficient documentation

## 2013-02-18 DIAGNOSIS — J45909 Unspecified asthma, uncomplicated: Secondary | ICD-10-CM | POA: Insufficient documentation

## 2013-02-18 DIAGNOSIS — Z87891 Personal history of nicotine dependence: Secondary | ICD-10-CM | POA: Insufficient documentation

## 2013-02-18 NOTE — ED Provider Notes (Signed)
CSN: [id number]     Arrival date & time 02/18/13  1024 History   First MD Initiated Contact with Patient 02/18/13 1040     Chief Complaint  Patient presents with  . Cough   (Consider location/radiation/quality/duration/timing/severity/associated sxs/prior Treatment) Patient is a 22 y.o. female presenting with cough. The history is provided by the patient.  Cough  issue here with one-week history of cough and URI symptoms. Cause been nonproductive and without associated fever or chills. No vomiting or diarrhea. No sore throat ear pain. No headache or neck pain. No treatment used prior to arrival. No recent abdominal pain but chest as well tight when she coughs. Notes positive sick exposures.  Past Medical History  Diagnosis Date  . Asthma   . Reflux    History reviewed. No pertinent past surgical history. History reviewed. No pertinent family history. History  Substance Use Topics  . Smoking status: Former Games developer  . Smokeless tobacco: Not on file  . Alcohol Use: Yes   OB History   Grav Para Term Preterm Abortions TAB SAB Ect Mult Living                 Review of Systems  Respiratory: Positive for cough.   All other systems reviewed and are negative.    Allergies  Review of patient's allergies indicates no known allergies.  Home Medications   Current Outpatient Rx  Name  Route  Sig  Dispense  Refill  . Diazepam (VALIUM PO)   Oral   Take by mouth.         Marland Kitchen HYDROcodone-acetaminophen (NORCO/VICODIN) 5-325 MG per tablet   Oral   Take 1 tablet by mouth every 6 (six) hours as needed for pain.   10 tablet   0   . ibuprofen (ADVIL,MOTRIN) 600 MG tablet   Oral   Take 1 tablet (600 mg total) by mouth every 6 (six) hours as needed for pain.   30 tablet   0   . LORazepam (ATIVAN) 1 MG tablet   Oral   Take 1 tablet (1 mg total) by mouth 3 (three) times daily as needed for anxiety.   15 tablet   0    BP 124/78  Pulse 96  Temp(Src) 98.3 F (36.8 C) (Oral)   Resp 16  Ht 5\' 3"  (1.6 m)  Wt 180 lb (81.647 kg)  BMI 31.89 kg/m2  SpO2 100% Physical Exam  Nursing note and vitals reviewed. Constitutional: She is oriented to person, place, and time. She appears well-developed and well-nourished.  Non-toxic appearance. No distress.  HENT:  Head: Normocephalic and atraumatic.  Eyes: Conjunctivae, EOM and lids are normal. Pupils are equal, round, and reactive to light.  Neck: Normal range of motion. Neck supple. No tracheal deviation present. No mass present.  Cardiovascular: Normal rate, regular rhythm and normal heart sounds.  Exam reveals no gallop.   No murmur heard. Pulmonary/Chest: Effort normal and breath sounds normal. No stridor. No respiratory distress. She has no decreased breath sounds. She has no wheezes. She has no rhonchi. She has no rales.  Abdominal: Soft. Normal appearance and bowel sounds are normal. She exhibits no distension. There is no tenderness. There is no rebound and no CVA tenderness.  Musculoskeletal: Normal range of motion. She exhibits no edema and no tenderness.  Neurological: She is alert and oriented to person, place, and time. She has normal strength. No cranial nerve deficit or sensory deficit. GCS eye subscore is 4. GCS verbal subscore is 5.  GCS motor subscore is 6.  Skin: Skin is warm and dry. No abrasion and no rash noted.  Psychiatric: She has a normal mood and affect. Her speech is normal and behavior is normal.    ED Course  Procedures (including critical care time) Labs Review Labs Reviewed - No data to display Imaging Review No results found.  EKG Interpretation   None       MDM  No diagnosis found. Patient afebrile here with normal pulse oximetry at room air. Patient with likely URI and was instructed to take over-the-counter medications as needed    Toy Baker, MD 02/18/13 1049

## 2013-02-18 NOTE — ED Notes (Signed)
C/o cough x 1 week, w some chest tightness and ciold symptoms

## 2013-09-19 IMAGING — CR DG CHEST 2V
2 series · 2 of 2 positions shown · non-contrast
Comparison: None.

CLINICAL DATA: Sternal chest pain

CHEST - 2 VIEW

[w chest pa]
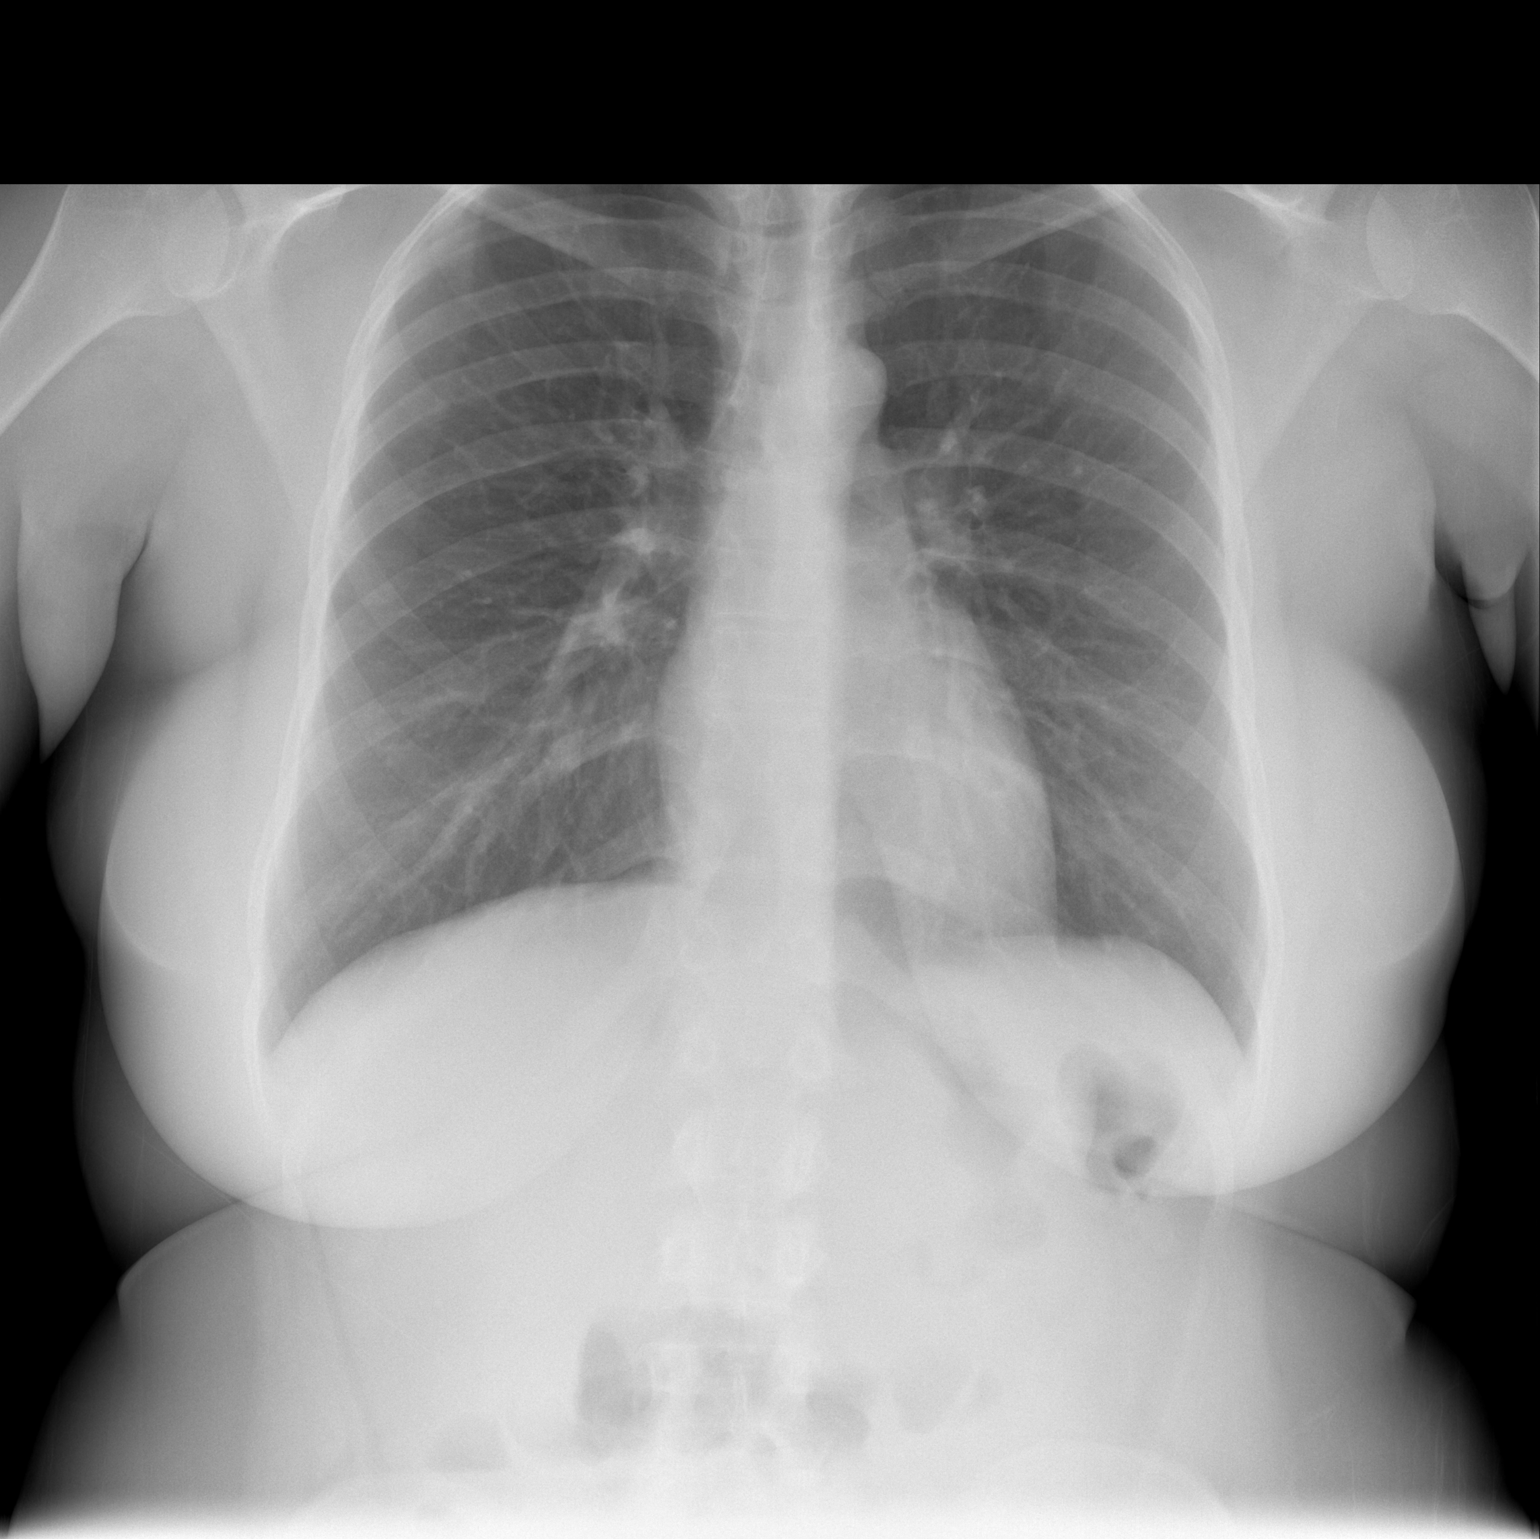

[w chest lat]
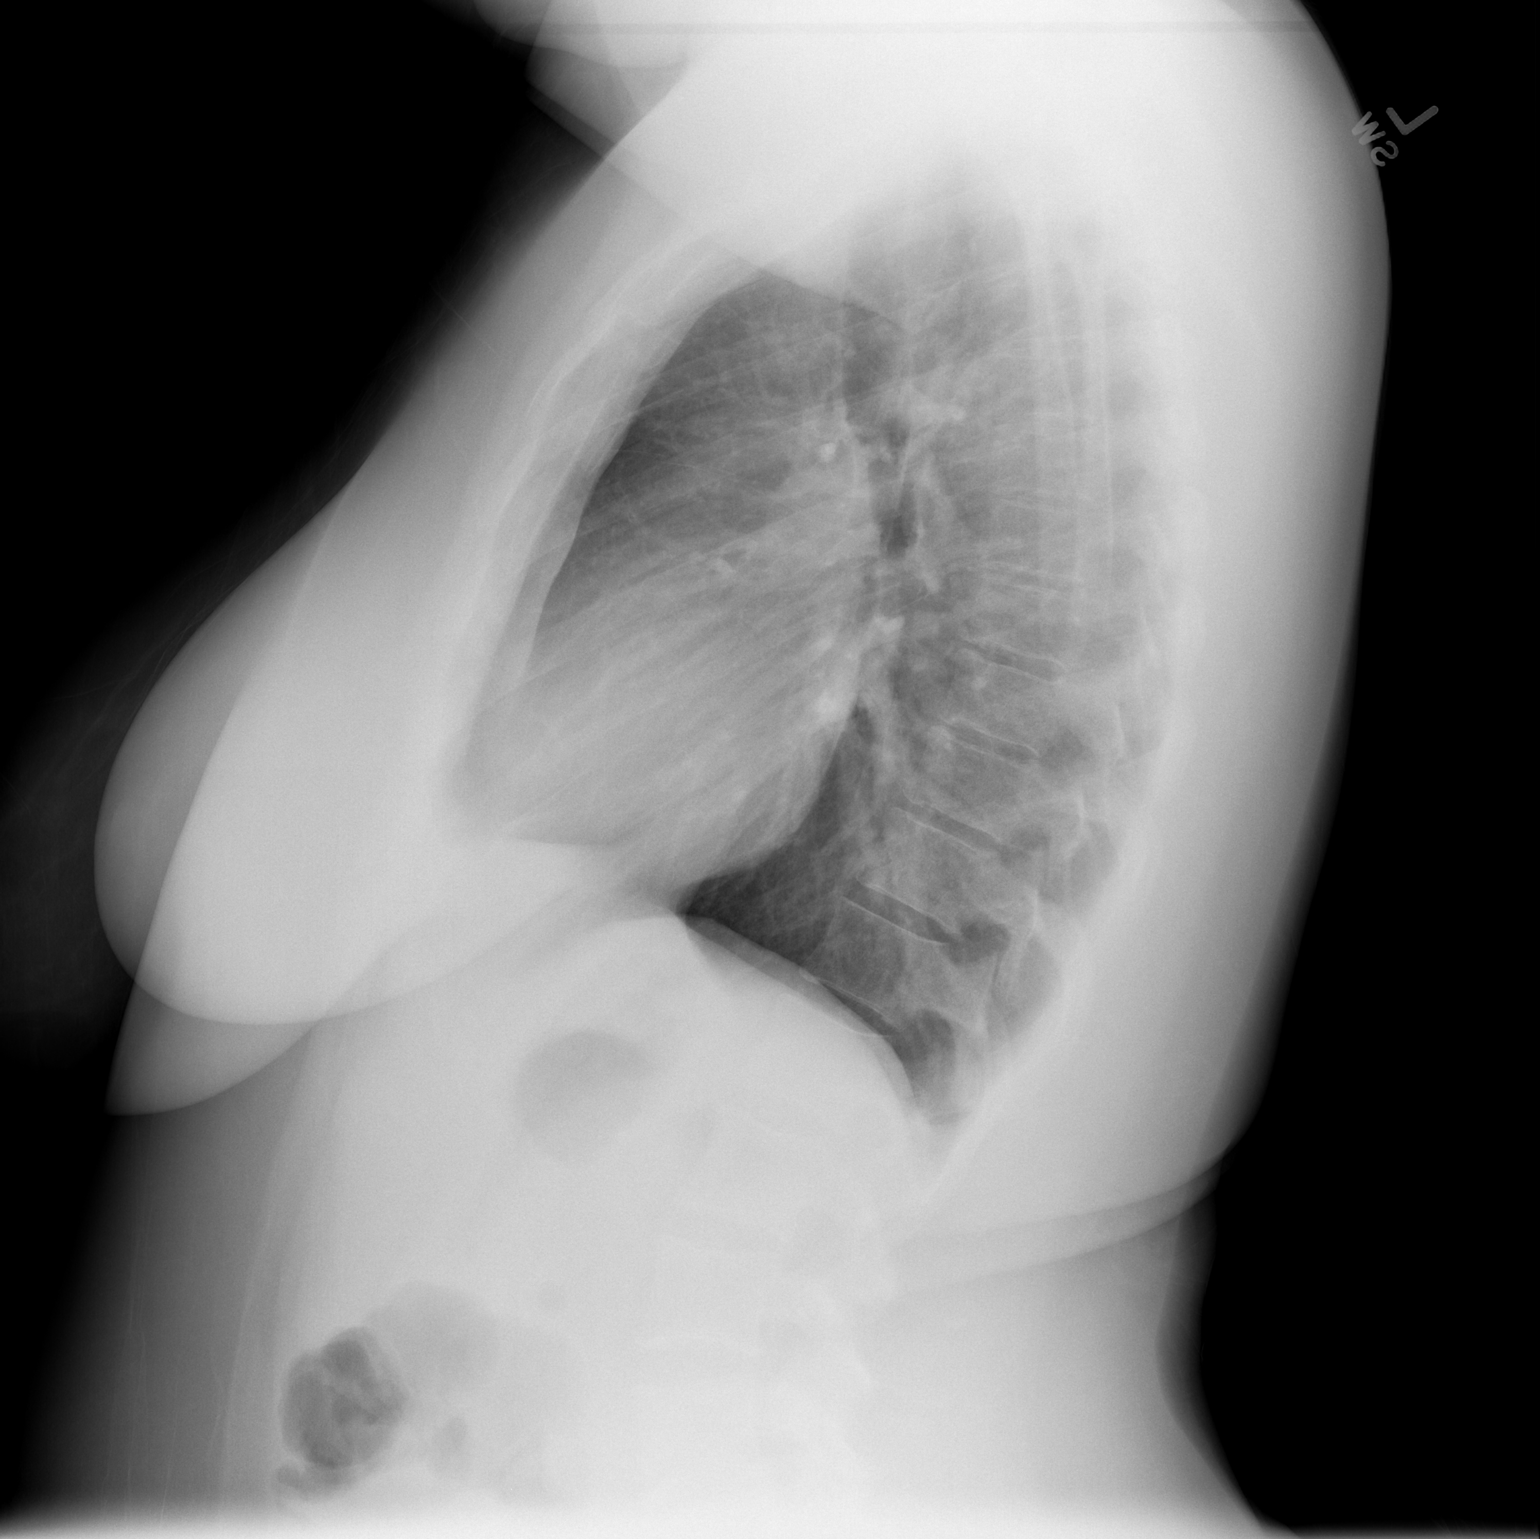

[2 of 2 positions shown; findings below may reference images not displayed]

FINDINGS: Normal mediastinum and cardiac silhouette.  Normal
pulmonary  vasculature.  No evidence of effusion, infiltrate, or
pneumothorax.  No acute bony abnormality.
IMPRESSION: Normal chest radiograph

## 2017-01-17 ENCOUNTER — Encounter (HOSPITAL_BASED_OUTPATIENT_CLINIC_OR_DEPARTMENT_OTHER): Payer: Self-pay | Admitting: Adult Health

## 2017-01-17 ENCOUNTER — Emergency Department (HOSPITAL_BASED_OUTPATIENT_CLINIC_OR_DEPARTMENT_OTHER)
Admission: EM | Admit: 2017-01-17 | Discharge: 2017-01-17 | Disposition: A | Discharge status: Home / Self Care | Payer: Self-pay | Attending: Emergency Medicine | Admitting: Emergency Medicine

## 2017-01-17 DIAGNOSIS — J45909 Unspecified asthma, uncomplicated: Secondary | ICD-10-CM | POA: Insufficient documentation

## 2017-01-17 DIAGNOSIS — Z87891 Personal history of nicotine dependence: Secondary | ICD-10-CM | POA: Insufficient documentation

## 2017-01-17 DIAGNOSIS — L03031 Cellulitis of right toe: Secondary | ICD-10-CM | POA: Insufficient documentation

## 2017-01-17 MED ORDER — SULFAMETHOXAZOLE-TRIMETHOPRIM 800-160 MG PO TABS: 1.0000 | ORAL_TABLET | Freq: Two times a day (BID) | ORAL | 0 refills | Status: AC

## 2017-01-17 NOTE — ED Provider Notes (Addendum)
[facility]   CSN: [id number] Arrival date & time: 01/17/17  1854     History   Chief Complaint Chief Complaint  Patient presents with  . Toe Pain    HPI Jasmin Williams is a 26 y.o. female.  HPI No patient presents with right third toe pain and swelling. There is a white spot on it. Has had for last few days. Patient states her boyfriend told her it was an ingrown toenail. No trauma to it. Has not had this before. Otherwise healthy. No numbness or weakness. Past Medical History:  Diagnosis Date  . Asthma   . Reflux     There are no active problems to display for this patient.   History reviewed. No pertinent surgical history.  OB History    No data available       Home Medications    Prior to Admission medications   Medication Sig Start Date End Date Taking? Authorizing Provider  Diazepam (VALIUM PO) Take by mouth.    [provider]  HYDROcodone-acetaminophen (NORCO/VICODIN) 5-325 MG per tablet Take 1 tablet by mouth every 6 (six) hours as needed for pain. 11/28/12   Derwood Kaplan, MD  ibuprofen (ADVIL,MOTRIN) 600 MG tablet Take 1 tablet (600 mg total) by mouth every 6 (six) hours as needed for pain. 11/28/12   Derwood Kaplan, MD  LORazepam (ATIVAN) 1 MG tablet Take 1 tablet (1 mg total) by mouth 3 (three) times daily as needed for anxiety. 10/22/12   Susy Frizzle, MD  sulfamethoxazole-trimethoprim (BACTRIM DS,SEPTRA DS) 800-160 MG tablet Take 1 tablet by mouth 2 (two) times daily. 01/17/17 01/24/17  Benjiman Core, MD    Family History History reviewed. No pertinent family history.  Social History Social History  Substance Use Topics  . Smoking status: Former Games developer  . Smokeless tobacco: Not on file  . Alcohol use Yes     Allergies   Patient has no known allergies.   Review of Systems Review of Systems  Constitutional: Negative for activity change, appetite change and fever.  Respiratory: Negative for  shortness of breath and stridor.   Cardiovascular: Negative for chest pain.  Musculoskeletal: Negative for joint swelling.  Skin: Positive for wound.  Neurological: Negative for weakness.     Physical Exam Updated Vital Signs BP (!) 121/92   Pulse 82   Temp 98.9 F (37.2 C) (Oral)   Resp 18   LMP 12/31/2016 (Approximate)   SpO2 100%   Physical Exam  Constitutional: She appears well-developed.  Musculoskeletal:  Paronychia on right third toe lateral aspect. Most the area is a big pustule. Some erythema proximally.  Neurological: She is alert.  Skin: Skin is warm.     ED Treatments / Results  Labs (all labs ordered are listed, but only abnormal results are displayed) Labs Reviewed - No data to display  EKG  EKG Interpretation None       Radiology No results found.  Procedures Drain paronychia Date/Time: 01/17/2017 8:11 PM Performed by: Benjiman Core Authorized by: Benjiman Core  Consent: Verbal consent obtained. Written consent not obtained. Risks and benefits: risks, benefits and alternatives were discussed Consent given by: patient Patient identity confirmed: verbally with patient Preparation: Patient was prepped and draped in the usual sterile fashion. Local anesthesia used: no  Anesthesia: Local anesthesia used: no  Sedation: Patient sedated: no Patient tolerance: Patient tolerated the procedure well with no immediate complications Comments: Single incision with a 15 blade scalpel along the nail fold on  the lateral aspect of the toe. Purulent drainage.    (including critical care time)  Medications Ordered in ED Medications - No data to display   Initial Impression / Assessment and Plan / ED Course  I have reviewed the triage vital signs and the nursing notes.  Pertinent labs & imaging results that were available during my care of the patient were reviewed by me and considered in my medical decision making (see chart for details).       Paronychia right third toe. Open was single scalpe  incision. Will give antibiotics that she can take if more erythema develops or that does not improve. Discharge home.  Final Clinical Impressions(s) / ED Diagnoses   Final diagnoses:  Paronychia of third toe, right    New Prescriptions New Prescriptions   SULFAMETHOXAZOLE-TRIMETHOPRIM (BACTRIM DS,SEPTRA DS) 800-160 MG TABLET    Take 1 tablet by mouth 2 (two) times daily.     Benjiman Core, MD 01/17/17 2010    Benjiman Core, MD 01/17/17 2012

## 2017-01-17 NOTE — ED Triage Notes (Signed)
Presents with ingrown toe nail that began today, she states she has never had one so she was not sure what it was and it made her worried. She has not tried any therapies at home.

## 2017-01-17 NOTE — Discharge Instructions (Signed)
Take the antibiotics if more redness develops or if the swelling returns.

## 2022-07-16 ENCOUNTER — Encounter (HOSPITAL_BASED_OUTPATIENT_CLINIC_OR_DEPARTMENT_OTHER): Payer: Self-pay | Admitting: Emergency Medicine

## 2022-07-16 ENCOUNTER — Emergency Department (HOSPITAL_BASED_OUTPATIENT_CLINIC_OR_DEPARTMENT_OTHER)
Admission: EM | Admit: 2022-07-16 | Discharge: 2022-07-16 | Disposition: A | Discharge status: Home / Self Care | Payer: Medicaid Other | Attending: Emergency Medicine | Admitting: Emergency Medicine

## 2022-07-16 ENCOUNTER — Other Ambulatory Visit: Payer: Self-pay

## 2022-07-16 DIAGNOSIS — Z91010 Allergy to peanuts: Secondary | ICD-10-CM | POA: Insufficient documentation

## 2022-07-16 DIAGNOSIS — J45909 Unspecified asthma, uncomplicated: Secondary | ICD-10-CM | POA: Diagnosis not present

## 2022-07-16 DIAGNOSIS — L720 Epidermal cyst: Secondary | ICD-10-CM | POA: Insufficient documentation

## 2022-07-16 DIAGNOSIS — L02412 Cutaneous abscess of left axilla: Secondary | ICD-10-CM | POA: Diagnosis present

## 2022-07-16 MED ORDER — DOXYCYCLINE HYCLATE 100 MG PO CAPS: 100.0000 mg | ORAL_CAPSULE | Freq: Two times a day (BID) | ORAL | 0 refills | Status: AC

## 2022-07-16 MED ORDER — LIDOCAINE-EPINEPHRINE (PF) 2 %-1:200000 IJ SOLN
20.0000 mL | Freq: Once | INTRAMUSCULAR | Status: AC
  Administered 2022-07-16: 20 mL
  Filled 2022-07-16: qty 20

## 2022-07-16 NOTE — ED Triage Notes (Signed)
Patient presents with abscess to L axilla. Present X1 week. No active drainage

## 2022-07-16 NOTE — ED Provider Notes (Signed)
[facility] Provider Note   CSN: [id number] Arrival date & time: 07/16/22  0818     History  Chief Complaint  Patient presents with   Abscess    Jasmin Williams is a 32 y.o. female with a past medical history of asthma presents today for evaluation of abscess/cyst.  Patient states she noticed an abscess in her left axilla for 7 days.  She reports itching and pain and mild fluid drainage.  Patient states she had the same abscess/cyst in the same locations before but this time it seems to be worse.  She has not tried any medications for pain.  She denies any fever.   Abscess   Past Medical History:  Diagnosis Date   Asthma    Reflux      Home Medications Prior to Admission medications   Medication Sig Start Date End Date Taking? Authorizing Provider  doxycycline (VIBRAMYCIN) 100 MG capsule Take 1 capsule (100 mg total) by mouth 2 (two) times daily. 07/16/22  Yes Rex Kras, PA  Diazepam (VALIUM PO) Take by mouth.    [provider]  HYDROcodone-acetaminophen (NORCO/VICODIN) 5-325 MG per tablet Take 1 tablet by mouth every 6 (six) hours as needed for pain. 11/28/12   Varney Biles, MD  ibuprofen (ADVIL,MOTRIN) 600 MG tablet Take 1 tablet (600 mg total) by mouth every 6 (six) hours as needed for pain. 11/28/12   Varney Biles, MD  LORazepam (ATIVAN) 1 MG tablet Take 1 tablet (1 mg total) by mouth 3 (three) times daily as needed for anxiety. 10/22/12   Truddie Hidden, MD      Allergies    Peanut-containing drug products    Review of Systems   Review of Systems Negative except as per HPI.  Physical Exam Updated Vital Signs BP (!) 131/92 (BP Location: Left Arm)   Pulse 79   Temp 98.4 F (36.9 C) (Oral)   Resp 17   Ht 5\' 4"  (1.626 m)   Wt 81.6 kg   SpO2 100%   BMI 30.90 kg/m  Physical Exam Vitals and nursing note reviewed.  Constitutional:      Appearance: Normal appearance.  HENT:     Head: Normocephalic and  atraumatic.     Mouth/Throat:     Mouth: Mucous membranes are moist.  Eyes:     General: No scleral icterus. Cardiovascular:     Rate and Rhythm: Normal rate and regular rhythm.     Pulses: Normal pulses.     Heart sounds: Normal heart sounds.  Pulmonary:     Effort: Pulmonary effort is normal.     Breath sounds: Normal breath sounds.  Abdominal:     General: Abdomen is flat.     Palpations: Abdomen is soft.     Tenderness: There is no abdominal tenderness.  Musculoskeletal:        General: No deformity.  Skin:    General: Skin is warm.     Findings: No rash.     Comments: 1 x 1 cm fluctuant mass in the left axilla with minimal fluid drainage and mild skin erythema.  No bleeding.  Neurological:     General: No focal deficit present.     Mental Status: She is alert.  Psychiatric:        Mood and Affect: Mood normal.     ED Results / Procedures / Treatments   Labs (all labs ordered are listed, but only abnormal results are displayed) Labs Reviewed -  No data to display  EKG None  Radiology No results found.  Procedures Procedures    Medications Ordered in ED Medications  lidocaine-EPINEPHrine (XYLOCAINE W/EPI) 2 %-1:200000 (PF) injection 20 mL (has no administration in time range)    ED Course/ Medical Decision Making/ A&P                             Medical Decision Making Risk Prescription drug management.   This patient presents to the ED for abscess/cyst, this involves an extensive number of treatment options, and is a complaint that carries with a high risk of complications and morbidity.  The differential diagnosis includes abscess, epidermoid cyst, cellulitis.  This is not an exhaustive list.  Imaging studies: Bedside ultrasound was done by this provider show 1 x 1 cm fluctuant mass in the left axilla.  Problem list/ ED course/ Critical interventions/ Medical management: HPI: See above Vital signs within normal range and stable throughout  visit. Laboratory/imaging studies significant for: See above. On physical examination, patient is afebrile and appears in no acute distress. This patient presents with a painful fluid pocket with fluctuance and surrounding induration and erythema, concerning for an epidermal cyst. The cyst was anesthetized with lidocaine and then I&D was performed with deloculation and purulence was expressed. There is no lymphangitic spread visible. Low concern for osteomyelitis. Patient is not immunocompromised, and there is no bullae, pain out of proportion, or rapid progression concerning for necrotizing fasciitis. Patient to be discharged home with doxy with follow up with their PCP. I have reviewed the patient home medicines and have made adjustments as needed.  Cardiac monitoring/EKG: The patient was maintained on a cardiac monitor.  I personally reviewed and interpreted the cardiac monitor which showed an underlying rhythm of: sinus rhythm.  Additional history obtained: External records from outside source obtained and reviewed including: Chart review including previous notes, labs, imaging.  Consultations obtained:  Disposition Continued outpatient therapy. Follow-up with PCP recommended for reevaluation of symptoms. Treatment plan discussed with patient.  Pt acknowledged understanding was agreeable to the plan. Worrisome signs and symptoms were discussed with patient, and patient acknowledged understanding to return to the ED if they noticed these signs and symptoms. Patient was stable upon discharge.   This chart was dictated using voice recognition software.  Despite best efforts to proofread,  errors can occur which can change the documentation meaning.          Final Clinical Impression(s) / ED Diagnoses Final diagnoses:  Epidermoid cyst    Rx / DC Orders ED Discharge Orders          Ordered    doxycycline (VIBRAMYCIN) 100 MG capsule  2 times daily        07/16/22 0945               Rex Kras, PA 07/16/22 0946    Long, Wonda [facility], MD 07/19/22 1600

## 2022-07-16 NOTE — Discharge Instructions (Addendum)
Please take your medications as prescribed. Take tylenol/ibuprofen for pain. I recommend close follow-up with PCP for reevaluation.  Please do not hesitate to return to emergency department if worrisome signs symptoms we discussed become apparent.
